# Patient Record
Sex: Female | Born: 1965 | Race: White | Hispanic: No | Marital: Married
Health system: Southern US, Community
[De-identification: ages and names within clinical notes are randomized; demographics above are authoritative.]

---

## 2010-02-20 ENCOUNTER — Ambulatory Visit: Payer: Self-pay | Admitting: Obstetrics & Gynecology

## 2010-02-20 ENCOUNTER — Inpatient Hospital Stay (HOSPITAL_COMMUNITY): Admission: AD | Admit: 2010-02-20 | Discharge: 2010-02-20 | Payer: Self-pay | Admitting: Obstetrics & Gynecology

## 2010-10-02 LAB — CBC
HCT: 35.8 % — ABNORMAL LOW (ref 36.0–46.0)
MCH: 33.8 pg (ref 26.0–34.0)
RBC: 3.65 MIL/uL — ABNORMAL LOW (ref 3.87–5.11)
RDW: 13.1 % (ref 11.5–15.5)
WBC: 5.5 10*3/uL (ref 4.0–10.5)

## 2010-10-02 LAB — GC/CHLAMYDIA PROBE AMP, GENITAL: GC Probe Amp, Genital: NEGATIVE

## 2010-10-02 LAB — URINALYSIS, ROUTINE W REFLEX MICROSCOPIC
Bilirubin Urine: NEGATIVE
Glucose, UA: NEGATIVE mg/dL
Ketones, ur: NEGATIVE mg/dL
Nitrite: NEGATIVE
Specific Gravity, Urine: <1.005 — ABNORMAL LOW (ref 1.005–1.030)
Urobilinogen, UA: 0.2 mg/dL (ref 0.0–1.0)

## 2010-10-02 LAB — WET PREP, GENITAL: Clue Cells Wet Prep HPF POC: NONE SEEN

## 2010-10-02 LAB — POCT PREGNANCY, URINE: Preg Test, Ur: NEGATIVE

## 2011-02-28 IMAGING — US US PELVIS COMPLETE MODIFY
1 series · 13 of 25 positions shown · non-contrast
Comparison: None.

CLINICAL DATA: Left lower quadrant pain.  LMP 01/25/2010



[Series 1: us transvaginal non-ob · 0.22mm/px · 13 of 43 slices shown]
[im 1/43]
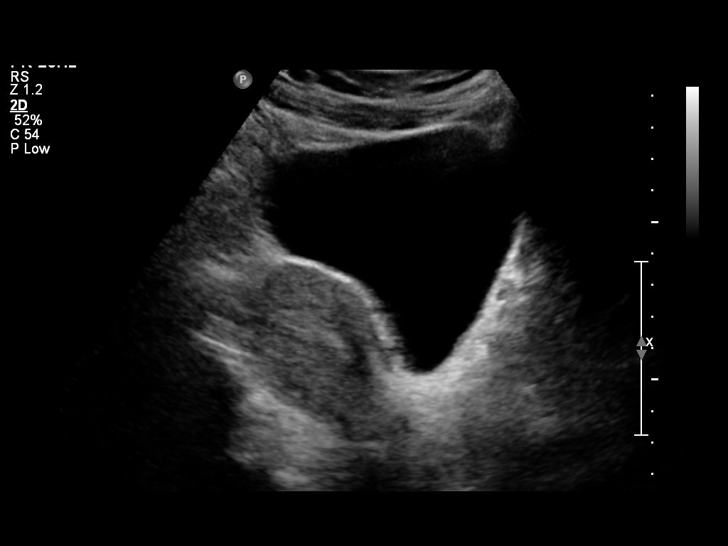
[im 4/43]
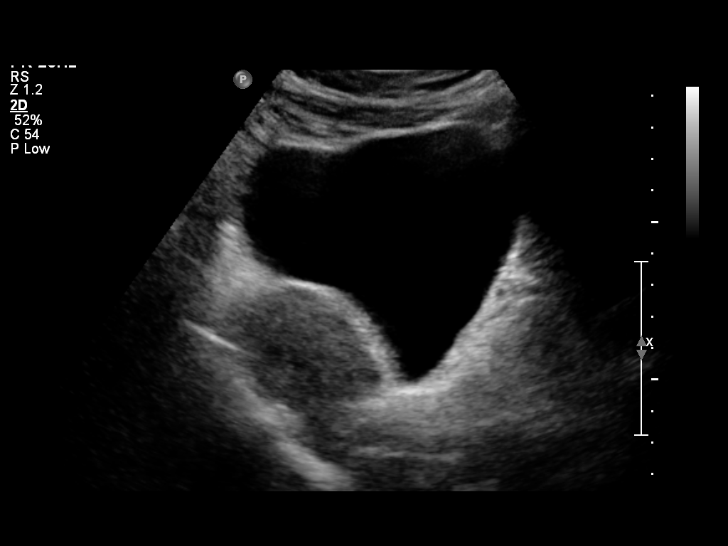
[im 8/43]
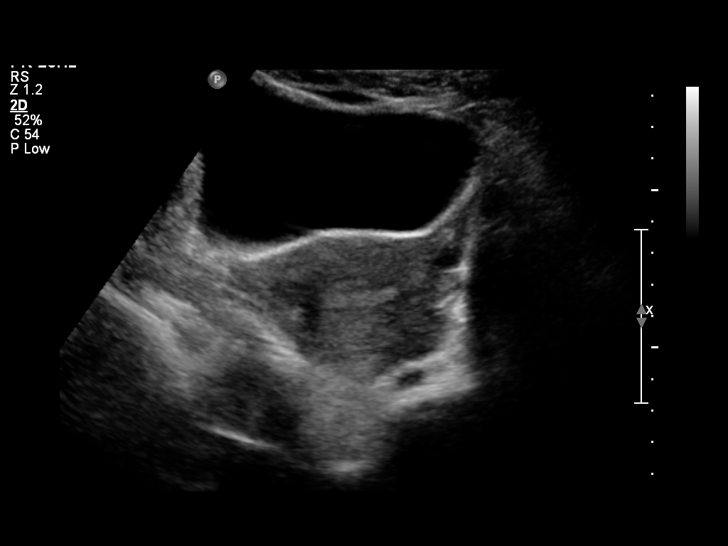
[im 11/43]
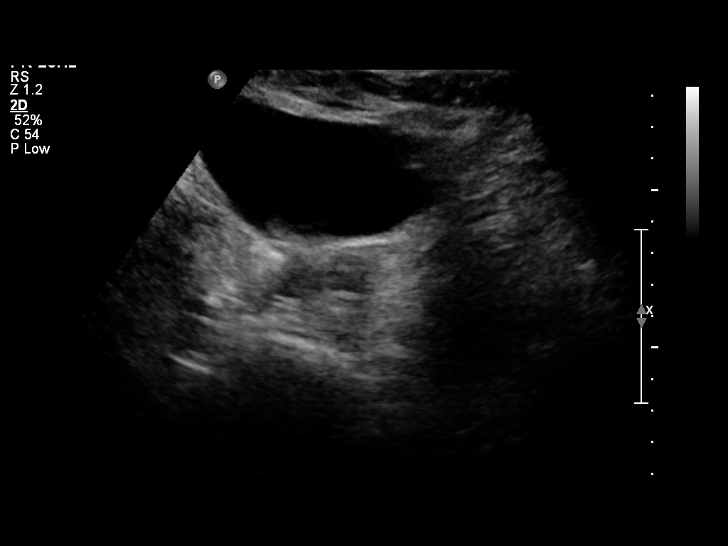
[im 15/43]
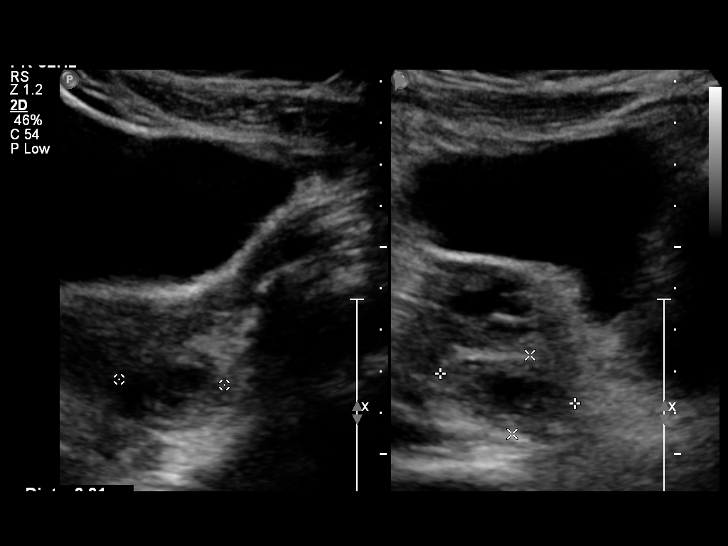
[im 18/43]
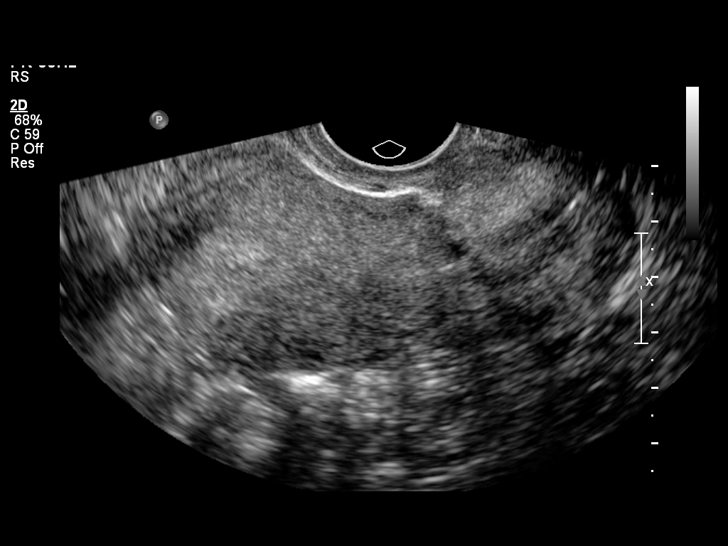
[im 22/43]
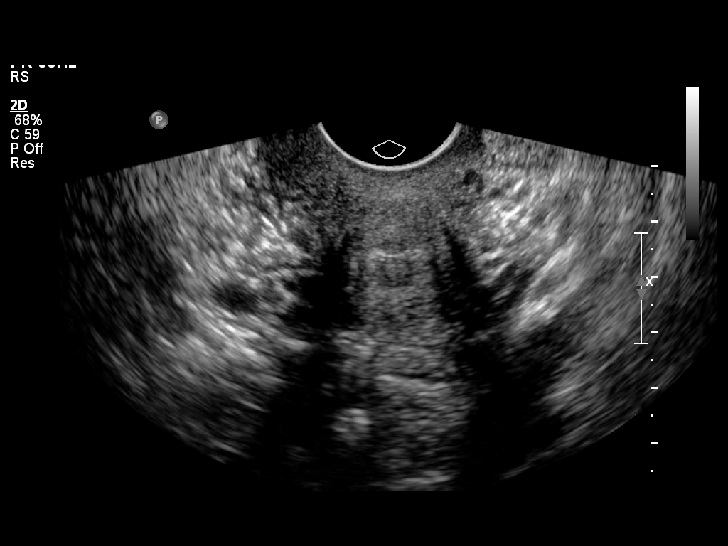
[im 25/43]
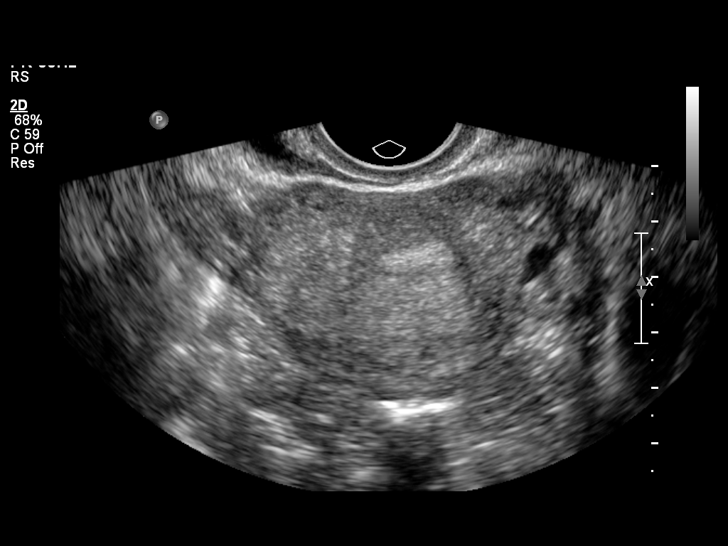
[im 29/43]
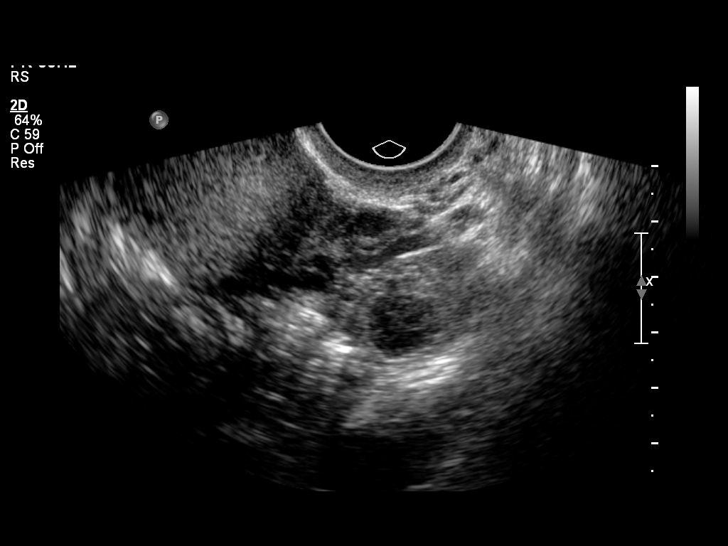
[im 32/43]
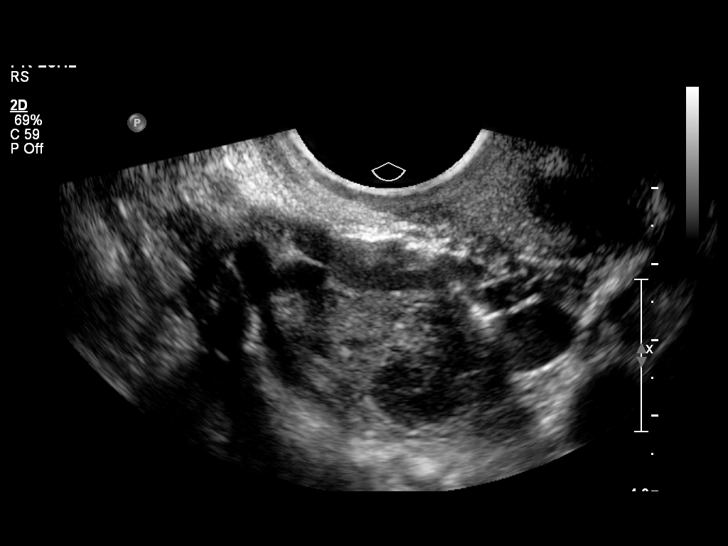
[im 36/43]
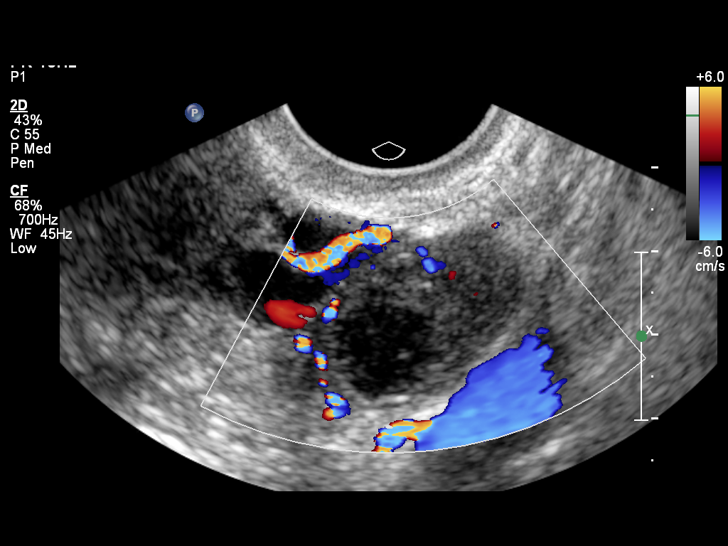
[im 39/43]
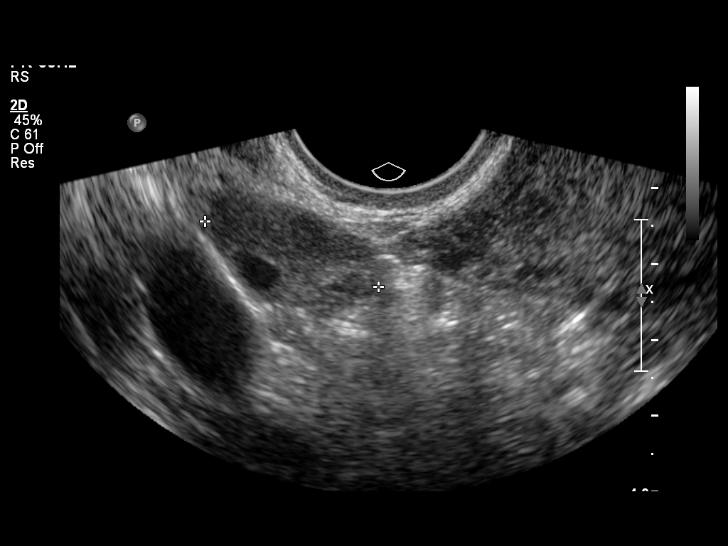
[im 43/43]
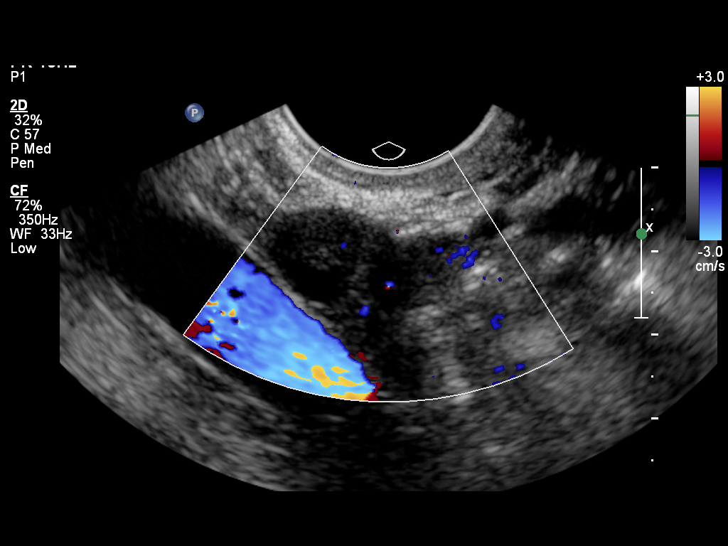

[13 of 25 positions shown; findings below may reference images not displayed]

FINDINGS: Uterus the uterus demonstrates a sagittal length of 8.8 cm, an AP
depth of 4.6 cm and a transverse width of 5.6 cm.  A homogeneous
uterine myometrium is seen.  An anterior C-section scar is noted.

Endometrium demonstrates a late tri- layered appearance with an AP
width of 8.7 mm.  No areas of focal thickening or inhomogeneity are
seen and this would correlate with a late periovulatory endometrial
stripe.

Right Ovary measures 2.4 x 2.2 x 1.6 cm and has a normal appearance

Left Ovary measures 2.3 by 3.0 x 2.3 cm and contains a small
complex cystic area measuring 1.4 by 1.1 x 1.2 cm.  This
demonstrates peripheral flow and has a sonographic appearance
suggestive of a small hemorrhagic corpus luteum.  This would
correlate with expected late periovulatory phase of cycle based on
LMP of 01/25/2010.

Other Findings:  No pelvic fluid or separate adnexal masses are
seen.
IMPRESSION: Normal uterine myometrium endometrium and right ovary.  Small
complex left ovarian cyst with an appearance suggestive of a small
hemorrhagic corpus luteum.  This would correlate with expected
phase of cycle given the patient's LMP of 01/25/2010.  Given the
presence of left lower quadrant pain, this can be followed up to
confirm that this represents a functional process with rescanning
in the postsecretory phase of the cycle following the next complete
cycle.

## 2015-04-25 ENCOUNTER — Other Ambulatory Visit: Payer: Self-pay | Admitting: Certified Nurse Midwife

## 2015-04-28 NOTE — Telephone Encounter (Signed)
Medication refill request: Nuvaring  Last AEX:  ? Next AEX: ? Last MMG (if hormonal medication request): ? Refill authorized: Never
